# Patient Record
Sex: Female | Born: 1990 | Hispanic: No | Marital: Single | Smoking: Never smoker
Health system: Southern US, Community
[De-identification: ages and names within clinical notes are randomized; demographics above are authoritative.]

---

## 2015-02-06 ENCOUNTER — Emergency Department (HOSPITAL_COMMUNITY)
Admission: EM | Admit: 2015-02-06 | Discharge: 2015-02-06 | Disposition: A | Discharge status: Home / Self Care | Payer: BLUE CROSS/BLUE SHIELD | Attending: Emergency Medicine | Admitting: Emergency Medicine

## 2015-02-06 ENCOUNTER — Encounter (HOSPITAL_COMMUNITY): Payer: Self-pay | Admitting: Emergency Medicine

## 2015-02-06 DIAGNOSIS — J029 Acute pharyngitis, unspecified: Secondary | ICD-10-CM | POA: Insufficient documentation

## 2015-02-06 LAB — RAPID STREP SCREEN (MED CTR MEBANE ONLY): Streptococcus, Group A Screen (Direct): NEGATIVE

## 2015-02-06 NOTE — ED Notes (Signed)
Pt reports ongoing sore throat since Monday; denies pain at present time.

## 2015-02-06 NOTE — ED Provider Notes (Signed)
CSN: [id number]     Arrival date & time 02/06/15  1126 History  This chart was scribed for Eyvonne Mechanic, PA-C, working with Purvis Sheffield, MD by Octavia Heir, ED Scribe. This patient was seen in room WTR8/WTR8 and the patient's care was started at 12:10 PM.     Chief Complaint  Patient presents with  . Sore Throat     The history is provided by the patient. No language interpreter was used.  HPI Comments: Nancy Rangel is a 24 y.o. female who presents to the Emergency Department complaining of sore throat, x 6 days. Patient reports that pain is worse with swallowing, almost nonexistent at baseline. She notes yesterday her throat was more swollen than normal which caused concern. Pt reports feeling more tired than normal. Pt reports taking penicillin that her mother had to alleviate the pain with minimal relief. Pt denies difficulty swallowing, shortness of breath, rhinorrhea, cough, neck pain, watery eyes, rash, swelling of her legs. Pt further denies history of allergies. Patient has no other concerns at today's visit, reports that she was at work today was instructed to follow-up in the ED.   History reviewed. No pertinent past medical history. History reviewed. No pertinent past surgical history. No family history on file. History  Substance Use Topics  . Smoking status: Never Smoker   . Smokeless tobacco: Not on file  . Alcohol Use: No   OB History    No data available     Review of Systems  All other systems reviewed and are negative.   Allergies  Review of patient's allergies indicates no known allergies.  Home Medications   Prior to Admission medications   Not on File    Triage vitals: BP 127/85 mmHg  Pulse 94  Temp(Src) 98.6 F (37 C) (Oral)  Resp 16  SpO2 99%  LMP 02/02/2015 (Exact Date)  Physical Exam  Constitutional: She is oriented to person, place, and time. She appears well-developed and well-nourished. No distress.  HENT:  Head: Normocephalic and  atraumatic.  Mouth/Throat: Uvula is midline, oropharynx is clear and moist and mucous membranes are normal. No oropharyngeal exudate, posterior oropharyngeal edema, posterior oropharyngeal erythema or tonsillar abscesses.  Eyes: Conjunctivae and EOM are normal. Pupils are equal, round, and reactive to light. No scleral icterus.  Neck: Normal range of motion. Neck supple. No thyromegaly present.  Cardiovascular: Regular rhythm and normal heart sounds.  Exam reveals no gallop and no friction rub.   No murmur heard. Pulmonary/Chest: Effort normal and breath sounds normal. No respiratory distress.  Abdominal: She exhibits no distension. There is no tenderness. There is no rebound.  Musculoskeletal: Normal range of motion. She exhibits no edema.  Neurological: She is alert and oriented to person, place, and time. No sensory deficit.  Skin: Skin is warm and dry. No rash noted.  Psychiatric: She has a normal mood and affect. Her behavior is normal.  Nursing note and vitals reviewed.   ED Course  Procedures  DIAGNOSTIC STUDIES: Oxygen Saturation is 99% on RA, normal by my interpretation.  COORDINATION OF CARE:  12:33 PM   Labs Review Labs Reviewed  RAPID STREP SCREEN (NOT AT [facility])  CULTURE, GROUP A STREP    Imaging Review No results found.   EKG Interpretation None      MDM   Final diagnoses:  Sore throat    Labs:none   Imaging: none  Consults: none   Therapeutics: rapid strep negative   Assessment: pharyngitis   Plan: patient presents  with a sore throat for the last 6 days. She reports it's not significant enough to interfere with daily activities, she reports she's been working, has been able to eat and drink without difficulty, no fever, neck stiffness, tender cervical adenopathy, no rashes. Patient has no upper respiratory symptoms, allergic type symptoms, or any other concerning findings that would indicate an infectious source. Her exam was benign with no findings.  Uncertain as to the etiology of her sore throat but highly unlikely to be bacterial, unlikely be allergic or viral at this time. No need for further evaluation in the ED setting. Patient presents at the request of her employers to make sure she did not have strep throat. Patient strep negative, given instructions to use warm saltwater gargles, ibuprofen or Tylenol as needed for pain, strict return precautions the event new worsening signs or symptoms present. Patient was encouraged to follow up with her primary care provider if symptoms persist, or worsen. She verbalized her understanding and agreement for today's plan and had no further questions concerns at time of discharge.    I personally performed the services described in this documentation, which was scribed in my presence. The recorded information has been reviewed and is accurate.    Eyvonne Mechanic, PA-C 02/06/15 1234  Eyvonne Mechanic, PA-C 02/06/15 1235  Purvis Sheffield, MD 02/06/15 1622

## 2015-02-06 NOTE — Discharge Instructions (Signed)
Please read attention information, monitor for new or worsening signs or symptoms follow-up immediately if any present. Please follow-up with your primary care provider inform them of your visit today and all relevant data.

## 2015-02-09 LAB — CULTURE, GROUP A STREP: STREP A CULTURE: NEGATIVE

## 2020-10-28 ENCOUNTER — Ambulatory Visit
Admission: RE | Admit: 2020-10-28 | Discharge: 2020-10-28 | Disposition: A | Discharge status: Home / Self Care | Payer: No Typology Code available for payment source | Source: Ambulatory Visit | Attending: Family Medicine | Admitting: Family Medicine

## 2020-10-28 ENCOUNTER — Other Ambulatory Visit (HOSPITAL_COMMUNITY): Payer: Self-pay | Admitting: Family Medicine

## 2020-10-28 ENCOUNTER — Other Ambulatory Visit: Payer: Self-pay | Admitting: Family Medicine

## 2020-10-28 DIAGNOSIS — R0602 Shortness of breath: Secondary | ICD-10-CM

## 2020-10-28 MED FILL — ALBUTEROL SULFATE HFA 108 (: 108 (90 BAS | 34 days supply | Qty: 18 | Fill #0

## 2020-10-30 ENCOUNTER — Ambulatory Visit: Payer: No Typology Code available for payment source

## 2021-05-25 ENCOUNTER — Ambulatory Visit
Admission: EM | Admit: 2021-05-25 | Discharge: 2021-05-25 | Disposition: A | Discharge status: Home / Self Care | Payer: No Typology Code available for payment source | Attending: Emergency Medicine | Admitting: Emergency Medicine

## 2021-05-25 ENCOUNTER — Other Ambulatory Visit: Payer: Self-pay

## 2021-05-25 ENCOUNTER — Other Ambulatory Visit (HOSPITAL_BASED_OUTPATIENT_CLINIC_OR_DEPARTMENT_OTHER): Payer: Self-pay

## 2021-05-25 DIAGNOSIS — M546 Pain in thoracic spine: Secondary | ICD-10-CM | POA: Diagnosis not present

## 2021-05-25 LAB — POCT URINALYSIS DIP (MANUAL ENTRY)
Bilirubin, UA: NEGATIVE
Glucose, UA: NEGATIVE mg/dL
Ketones, POC UA: NEGATIVE mg/dL
Leukocytes, UA: NEGATIVE
Nitrite, UA: NEGATIVE
Protein Ur, POC: NEGATIVE mg/dL
Spec Grav, UA: 1.02 (ref 1.010–1.025)
Urobilinogen, UA: 0.2 E.U./dL
pH, UA: 6 (ref 5.0–8.0)

## 2021-05-25 MED ORDER — TIZANIDINE HCL 2 MG PO TABS
2.0000 mg | ORAL_TABLET | Freq: Four times a day (QID) | ORAL | 0 refills | Status: AC | PRN
  Filled 2021-05-25: qty 30, 4d supply, fill #0

## 2021-05-25 MED ORDER — NAPROXEN 500 MG PO TABS
500.0000 mg | ORAL_TABLET | Freq: Two times a day (BID) | ORAL | 0 refills | Status: AC
  Filled 2021-05-25: qty 30, 15d supply, fill #0

## 2021-05-25 NOTE — Discharge Instructions (Addendum)
Urine does not show any signs of infection Please use Naprosyn twice daily with food Tizanidine to supplement at home/bedtime-this is a muscle relaxer, may cause drowsiness, do not drive or work after taking Avoid bedrest, avoid heavy lifting Alternate ice and heat Gentle stretching and movement Please follow-up if not improving or worsening

## 2021-05-25 NOTE — ED Provider Notes (Signed)
[facility]    CSN: [id number] Arrival date & time: 05/25/21  0957      History   Chief Complaint Chief Complaint  Patient presents with   Back Pain    HPI Nancy Rangel is a 30 y.o. female presenting today for evaluation of back pain.  Reports left-sided back pain beginning yesterday.  Located on lower thoracic/upper lumbar area, worse with movement and bending.  Describes a dull aching sensation.  Denies any radiation into abdomen, groin or leg.  Denies any urinary symptoms, but does express concern for possible UTI/kidney infection.  Denies fevers nausea or vomiting.  Denies history of stones.  Currently on menstrual cycle.  Feels different than typical back pain patient is experienced in the past.  HPI  History reviewed. No pertinent past medical history.  There are no problems to display for this patient.   History reviewed. No pertinent surgical history.  OB History   No obstetric history on file.      Home Medications    Prior to Admission medications   Medication Sig Start Date End Date Taking? Authorizing Provider  naproxen (NAPROSYN) 500 MG tablet Take 1 tablet (500 mg total) by mouth 2 (two) times daily. 05/25/21  Yes Nikolos Billig C, PA-C  tiZANidine (ZANAFLEX) 2 MG tablet Take 1-2 tablets (2-4 mg total) by mouth every 6 (six) hours as needed for muscle spasms. 05/25/21  Yes Ashlee Bewley C, PA-C  albuterol (VENTOLIN HFA) 108 (90 Base) MCG/ACT inhaler INHALE 1 PUFFS BY MOUTH INTO THE LUNGS EVERY 4 HOURS AS NEEDED 10/28/20 10/28/21  Shon Hale, MD    Family History No family history on file.  Social History Social History   Tobacco Use   Smoking status: Never   Smokeless tobacco: Never  Vaping Use   Vaping Use: Never used  Substance Use Topics   Alcohol use: Yes   Drug use: No     Allergies   Patient has no known allergies.   Review of Systems Review of Systems  Constitutional:  Negative for fatigue and fever.  HENT:   Negative for mouth sores.   Eyes:  Negative for visual disturbance.  Respiratory:  Negative for shortness of breath.   Cardiovascular:  Negative for chest pain.  Gastrointestinal:  Negative for abdominal pain, nausea and vomiting.  Musculoskeletal:  Positive for back pain and myalgias. Negative for arthralgias and joint swelling.  Skin:  Negative for color change, rash and wound.  Neurological:  Negative for dizziness, weakness, light-headedness and headaches.    Physical Exam Triage Vital Signs ED Triage Vitals  Enc Vitals Group     BP      Pulse      Resp      Temp      Temp src      SpO2      Weight      Height      Head Circumference      Peak Flow      Pain Score      Pain Loc      Pain Edu?      Excl. in GC?    No data found.  Updated Vital Signs BP 113/74 (BP Location: Right Arm)   Pulse 73   Temp 97.7 F (36.5 C) (Oral)   Resp 18   LMP 05/23/2021   SpO2 97%   Visual Acuity Right Eye Distance:   Left Eye Distance:   Bilateral Distance:    Right Eye  Near:   Left Eye Near:    Bilateral Near:     Physical Exam Vitals and nursing note reviewed.  Constitutional:      Appearance: She is well-developed.     Comments: No acute distress  HENT:     Head: Normocephalic and atraumatic.     Nose: Nose normal.  Eyes:     Conjunctiva/sclera: Conjunctivae normal.  Cardiovascular:     Rate and Rhythm: Normal rate.  Pulmonary:     Effort: Pulmonary effort is normal. No respiratory distress.     Comments: Breathing comfortably at rest, CTABL, no wheezing, rales or other adventitious sounds auscultated  Abdominal:     General: There is no distension.  Musculoskeletal:        General: Normal range of motion.     Cervical back: Neck supple.     Comments: Nontender to palpation of thoracic and lumbar spine midline, no palpable deformity or step-off, tenderness to palpation to lower thoracic and upper lumbar area on left side to far lateral aspect, no associated  rash or discoloration in area  Skin:    General: Skin is warm and dry.  Neurological:     Mental Status: She is alert and oriented to person, place, and time.     UC Treatments / Results  Labs (all labs ordered are listed, but only abnormal results are displayed) Labs Reviewed  POCT URINALYSIS DIP (MANUAL ENTRY) - Abnormal; Notable for the following components:      Result Value   Color, UA other (*)    Clarity, UA cloudy (*)    Blood, UA large (*)    All other components within normal limits    EKG   Radiology No results found.  Procedures Procedures (including critical care time)  Medications Ordered in UC Medications - No data to display  Initial Impression / Assessment and Plan / UC Course  I have reviewed the triage vital signs and the nursing notes.  Pertinent labs & imaging results that were available during my care of the patient were reviewed by me and considered in my medical decision making (see chart for details).     UA with negative leuks and nitrites, moderate hemoglobin, but said suspect likely from menstrual cycle and less likely underlying stone.  Suspect likely muscular cause of pain and recommending anti-inflammatories muscle relaxers, activity modification and monitoring for gradual resolution.  Discussed strict return precautions. Patient verbalized understanding and is agreeable with plan.  Final Clinical Impressions(s) / UC Diagnoses   Final diagnoses:  Acute left-sided thoracic back pain     Discharge Instructions      Urine does not show any signs of infection Please use Naprosyn twice daily with food Tizanidine to supplement at home/bedtime-this is a muscle relaxer, may cause drowsiness, do not drive or work after taking Avoid bedrest, avoid heavy lifting Alternate ice and heat Gentle stretching and movement Please follow-up if not improving or worsening     ED Prescriptions     Medication Sig Dispense Auth. Provider    naproxen (NAPROSYN) 500 MG tablet Take 1 tablet (500 mg total) by mouth 2 (two) times daily. 30 tablet Kyren Knick C, PA-C   tiZANidine (ZANAFLEX) 2 MG tablet Take 1-2 tablets (2-4 mg total) by mouth every 6 (six) hours as needed for muscle spasms. 30 tablet Vonte Rossin, [place] C, PA-C      PDMP not reviewed this encounter.   Lew Dawes, PA-C 05/25/21 1121

## 2021-05-25 NOTE — ED Triage Notes (Addendum)
Pt reports having some lower back pain to the left that started yesterday. Pt denies having any lower abd pain, or vaginal issues.

## 2021-06-17 ENCOUNTER — Other Ambulatory Visit (HOSPITAL_BASED_OUTPATIENT_CLINIC_OR_DEPARTMENT_OTHER): Payer: Self-pay

## 2021-06-17 MED ORDER — ALBUTEROL SULFATE HFA 108 (90 BASE) MCG/ACT IN AERS
INHALATION_SPRAY | RESPIRATORY_TRACT | 1 refills | Status: AC
  Filled 2021-06-17: qty 36, 66d supply, fill #0
  Filled 2022-03-29: qty 13.4, 56d supply, fill #0

## 2021-06-28 ENCOUNTER — Other Ambulatory Visit (HOSPITAL_BASED_OUTPATIENT_CLINIC_OR_DEPARTMENT_OTHER): Payer: Self-pay

## 2021-12-13 ENCOUNTER — Other Ambulatory Visit: Payer: Self-pay | Admitting: Family Medicine

## 2021-12-13 ENCOUNTER — Other Ambulatory Visit (HOSPITAL_COMMUNITY)
Admission: RE | Admit: 2021-12-13 | Discharge: 2021-12-13 | Disposition: A | Discharge status: Home / Self Care | Payer: No Typology Code available for payment source | Source: Ambulatory Visit | Attending: Family Medicine | Admitting: Family Medicine

## 2021-12-13 DIAGNOSIS — Z01411 Encounter for gynecological examination (general) (routine) with abnormal findings: Secondary | ICD-10-CM | POA: Insufficient documentation

## 2021-12-15 LAB — CYTOLOGY - PAP: Diagnosis: NEGATIVE

## 2022-01-10 ENCOUNTER — Other Ambulatory Visit (HOSPITAL_BASED_OUTPATIENT_CLINIC_OR_DEPARTMENT_OTHER): Payer: Self-pay

## 2022-01-10 ENCOUNTER — Telehealth: Payer: No Typology Code available for payment source | Admitting: Physician Assistant

## 2022-01-10 DIAGNOSIS — H1033 Unspecified acute conjunctivitis, bilateral: Secondary | ICD-10-CM | POA: Diagnosis not present

## 2022-01-10 MED ORDER — POLYMYXIN B-TRIMETHOPRIM 10000-0.1 UNIT/ML-% OP SOLN
OPHTHALMIC | 0 refills | Status: AC
  Filled 2022-01-10: qty 10, 25d supply, fill #0

## 2022-01-10 NOTE — Progress Notes (Signed)

## 2022-01-10 NOTE — Progress Notes (Signed)
I have spent 5 minutes in review of e-visit questionnaire, review and updating patient chart, medical decision making and response to patient.   Aidian Salomon Cody Quanta Roher, PA-C    

## 2022-02-27 IMAGING — DX DG CHEST 2V
2 series · 2 of 2 positions shown · non-contrast
Comparison: None.

CLINICAL DATA: 29-year-old female with history of shortness of
breath and dry cough following COVID infection in July 2020.

EXAM:
CHEST - 2 VIEW

[dg chest 2 view (1 of 2)]
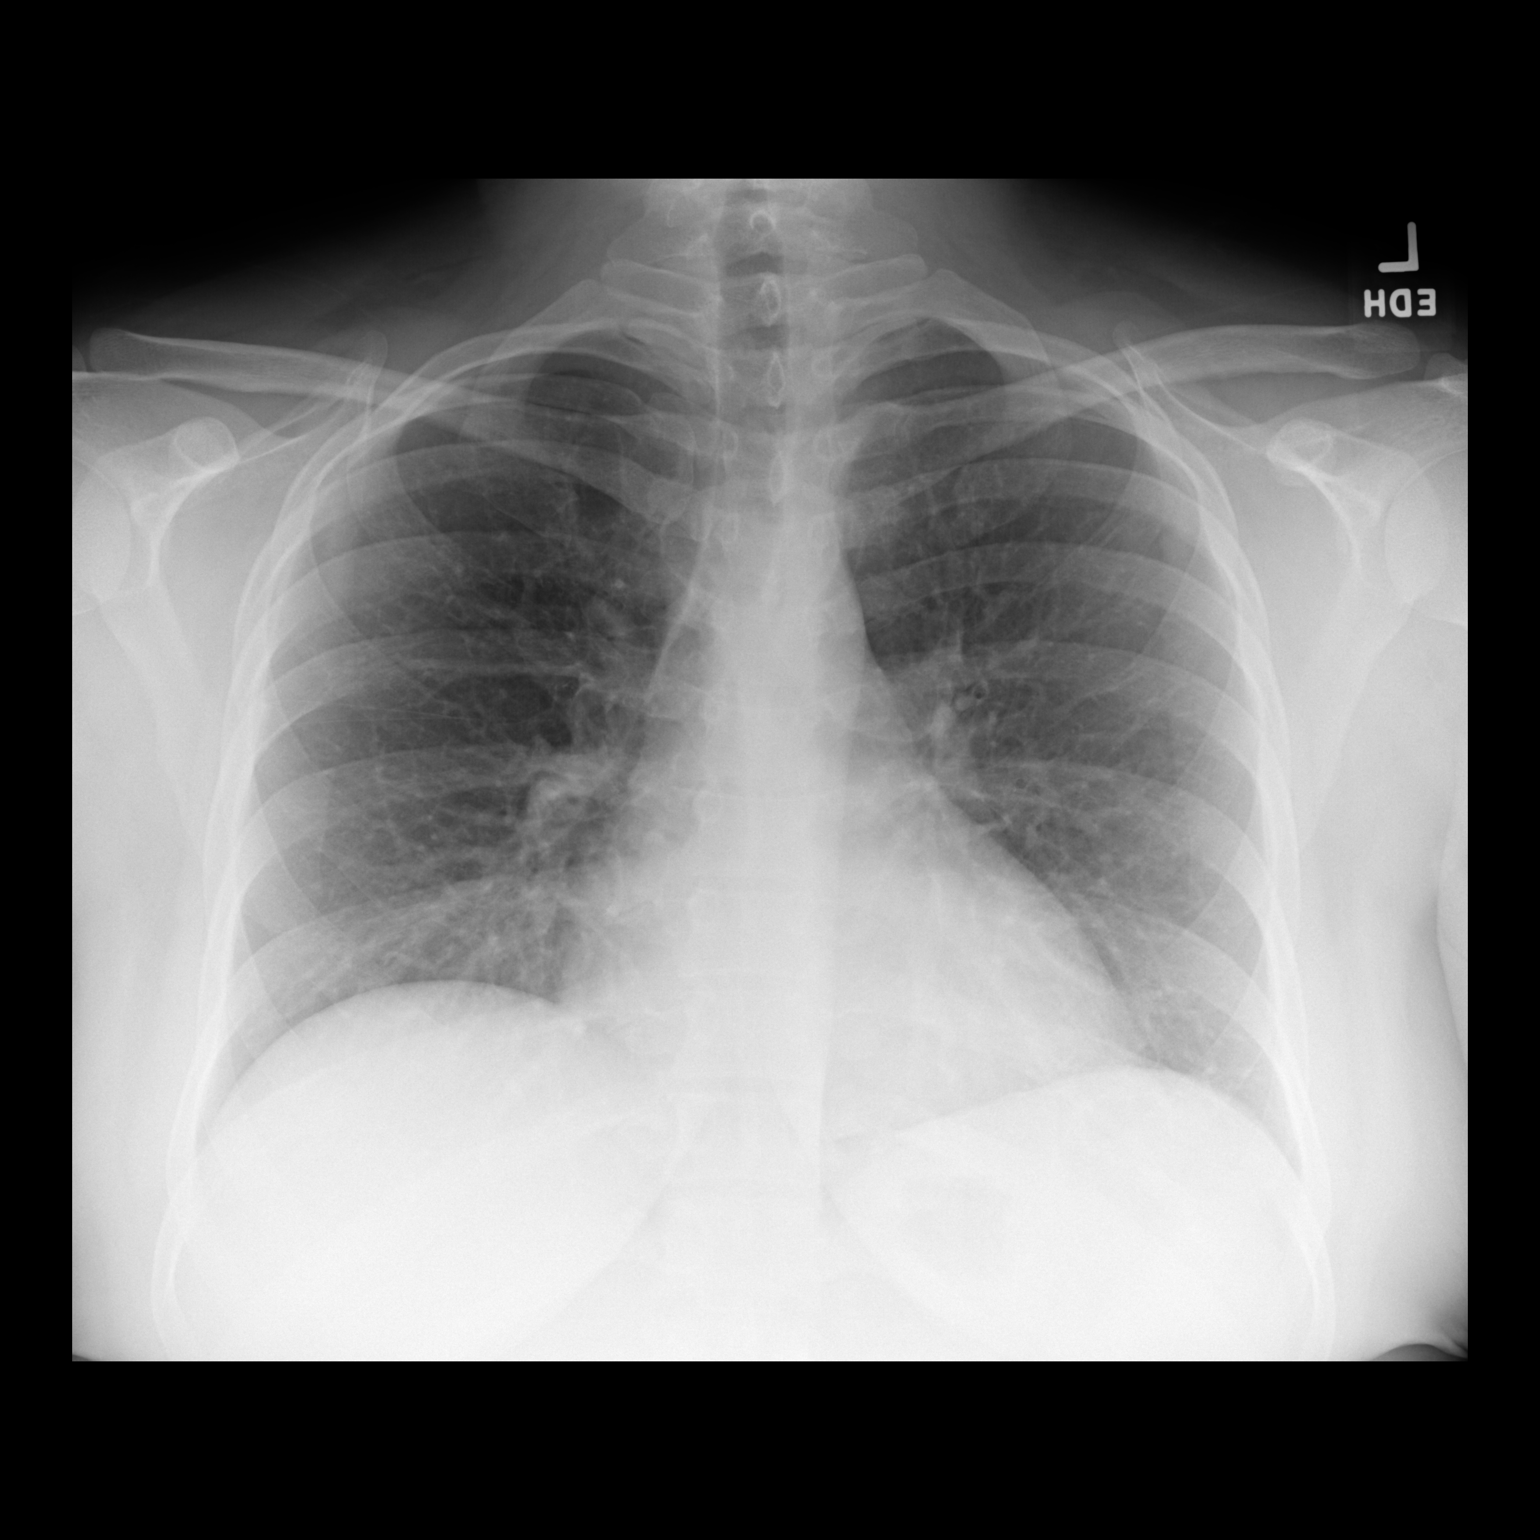

[dg chest 2 view (2 of 2)]
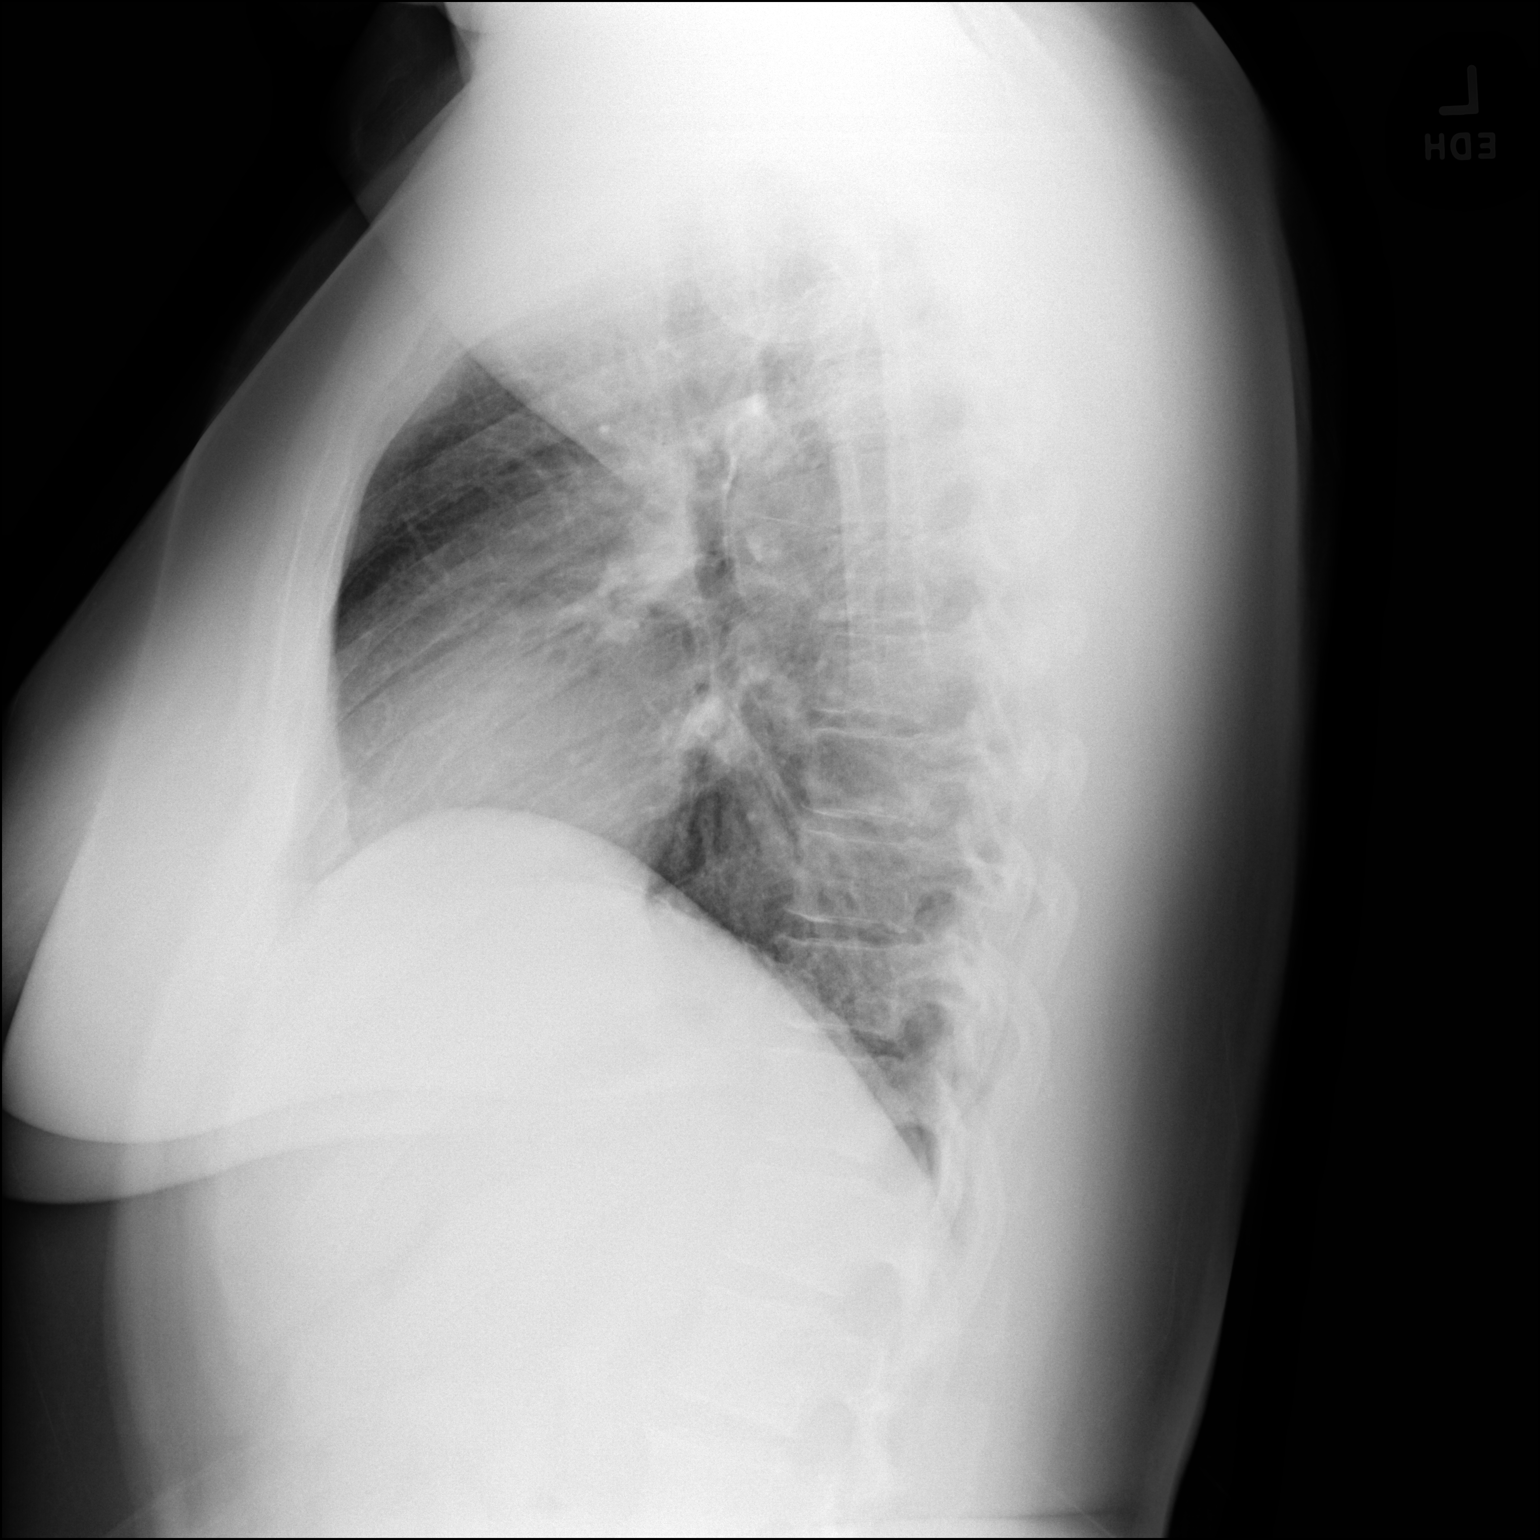

[2 of 2 positions shown; findings below may reference images not displayed]

FINDINGS: Lung volumes are normal. No consolidative airspace disease. No
pleural effusions. No pneumothorax. No pulmonary nodule or mass
noted. Pulmonary vasculature and the cardiomediastinal silhouette
are within normal limits.
IMPRESSION: No radiographic evidence of acute cardiopulmonary disease.

## 2022-03-11 ENCOUNTER — Ambulatory Visit (INDEPENDENT_AMBULATORY_CARE_PROVIDER_SITE_OTHER): Payer: No Typology Code available for payment source | Admitting: Podiatry

## 2022-03-11 ENCOUNTER — Other Ambulatory Visit (HOSPITAL_BASED_OUTPATIENT_CLINIC_OR_DEPARTMENT_OTHER): Payer: Self-pay

## 2022-03-11 ENCOUNTER — Encounter: Payer: Self-pay | Admitting: Podiatry

## 2022-03-11 ENCOUNTER — Ambulatory Visit (INDEPENDENT_AMBULATORY_CARE_PROVIDER_SITE_OTHER): Payer: No Typology Code available for payment source

## 2022-03-11 DIAGNOSIS — M722 Plantar fascial fibromatosis: Secondary | ICD-10-CM

## 2022-03-11 DIAGNOSIS — M79671 Pain in right foot: Secondary | ICD-10-CM

## 2022-03-11 DIAGNOSIS — M79672 Pain in left foot: Secondary | ICD-10-CM | POA: Diagnosis not present

## 2022-03-11 MED ORDER — MELOXICAM 15 MG PO TABS
15.0000 mg | ORAL_TABLET | Freq: Every day | ORAL | 0 refills | Status: AC
  Filled 2022-03-11 – 2022-03-29 (×2): qty 30, 30d supply, fill #0

## 2022-03-11 MED ORDER — DEXAMETHASONE SODIUM PHOSPHATE 120 MG/30ML IJ SOLN: 4.0000 mg | Freq: Once | INTRAMUSCULAR | Status: AC

## 2022-03-11 NOTE — Progress Notes (Signed)
  Subjective:  Patient ID: Nancy Rangel, female    DOB: 10-May-1991,   MRN: [id number]  Chief Complaint  Patient presents with   Foot Pain    Bilateral  heel pain    31 y.o. female presents for concern of bilateral heel pain. States right is worse than left. Has been dealing with it for several months. She fractured her right ankle last year. Relates she gets pain when standing for long periods and when walking. She has tried some OTC medications with some help. She is pre-diabetic. Denies any other pedal complaints. Denies n/v/f/c.   History reviewed. No pertinent past medical history.  Objective:  Physical Exam: Vascular: DP/PT pulses 2/4 bilateral. CFT <3 seconds. Normal hair growth on digits. No edema.  Skin. No lacerations or abrasions bilateral feet.  Musculoskeletal: MMT 5/5 bilateral lower extremities in DF, PF, Inversion and Eversion. Deceased ROM in DF of ankle joint. Tender to medial calcaneal tubercle on the right more so than the left.  Neurological: Sensation intact to light touch.   Assessment:   1. Plantar fasciitis, bilateral      Plan:  Patient was evaluated and treated and all questions answered. Discussed plantar fasciitis with patient.  X-rays reviewed and discussed with patient. No acute fractures or dislocations noted. Mild spurring noted at inferior calcaneus.  Discussed treatment options including, ice, NSAIDS, supportive shoes, bracing, and stretching. Stretching exercises provided to be done on a daily basis.   Prescription for meloxicam provided and sent to pharmacy. Patient requesting injection today. Procedure note below.   Pf brace dispensed.  Would like to try a night splint as well.  Follow-up 6 weeks or sooner if any problems arise. In the meantime, encouraged to call the office with any questions, concerns, change in symptoms.   Procedure:  Discussed etiology, pathology, conservative vs. surgical therapies. At this time a plantar fascial  injection was recommended.  The patient agreed and a sterile skin prep was applied.  An injection consisting of  dexamethasone and marcaine mixture was infiltrated at the point of maximal tenderness on the right Heel.  Bandaid applied. The patient tolerated this well and was given instructions for aftercare.     Louann Sjogren, DPM

## 2022-03-11 NOTE — Patient Instructions (Signed)

## 2022-03-21 ENCOUNTER — Other Ambulatory Visit (HOSPITAL_BASED_OUTPATIENT_CLINIC_OR_DEPARTMENT_OTHER): Payer: Self-pay

## 2022-03-29 ENCOUNTER — Other Ambulatory Visit (HOSPITAL_BASED_OUTPATIENT_CLINIC_OR_DEPARTMENT_OTHER): Payer: Self-pay

## 2022-04-22 ENCOUNTER — Ambulatory Visit: Payer: No Typology Code available for payment source | Admitting: Podiatry

## 2022-04-29 ENCOUNTER — Encounter: Payer: Self-pay | Admitting: Podiatry

## 2022-05-12 ENCOUNTER — Ambulatory Visit: Payer: No Typology Code available for payment source | Admitting: Podiatry

## 2022-05-19 ENCOUNTER — Ambulatory Visit: Payer: No Typology Code available for payment source | Admitting: Podiatry

## 2022-07-12 ENCOUNTER — Other Ambulatory Visit: Payer: Self-pay

## 2022-07-12 ENCOUNTER — Emergency Department (HOSPITAL_BASED_OUTPATIENT_CLINIC_OR_DEPARTMENT_OTHER)
Admission: EM | Admit: 2022-07-12 | Discharge: 2022-07-12 | Disposition: A | Discharge status: Home / Self Care | Payer: No Typology Code available for payment source | Attending: Emergency Medicine | Admitting: Emergency Medicine

## 2022-07-12 ENCOUNTER — Emergency Department (HOSPITAL_BASED_OUTPATIENT_CLINIC_OR_DEPARTMENT_OTHER): Payer: No Typology Code available for payment source

## 2022-07-12 ENCOUNTER — Encounter (HOSPITAL_BASED_OUTPATIENT_CLINIC_OR_DEPARTMENT_OTHER): Payer: Self-pay | Admitting: Emergency Medicine

## 2022-07-12 DIAGNOSIS — R1084 Generalized abdominal pain: Secondary | ICD-10-CM | POA: Diagnosis present

## 2022-07-12 DIAGNOSIS — K8020 Calculus of gallbladder without cholecystitis without obstruction: Secondary | ICD-10-CM

## 2022-07-12 LAB — URINALYSIS, ROUTINE W REFLEX MICROSCOPIC
Bilirubin Urine: NEGATIVE
Glucose, UA: NEGATIVE mg/dL
Hgb urine dipstick: NEGATIVE
Ketones, ur: NEGATIVE mg/dL
Leukocytes,Ua: NEGATIVE
Nitrite: NEGATIVE
Protein, ur: NEGATIVE mg/dL
Specific Gravity, Urine: 1.02 (ref 1.005–1.030)
pH: 8.5 — ABNORMAL HIGH (ref 5.0–8.0)

## 2022-07-12 LAB — COMPREHENSIVE METABOLIC PANEL
ALT: 18 U/L (ref 0–44)
AST: 17 U/L (ref 15–41)
Albumin: 3.7 g/dL (ref 3.5–5.0)
Alkaline Phosphatase: 79 U/L (ref 38–126)
Anion gap: 6 (ref 5–15)
BUN: 11 mg/dL (ref 6–20)
CO2: 23 mmol/L (ref 22–32)
Calcium: 8.6 mg/dL — ABNORMAL LOW (ref 8.9–10.3)
Chloride: 106 mmol/L (ref 98–111)
Creatinine, Ser: 0.57 mg/dL (ref 0.44–1.00)
GFR, Estimated: >60 mL/min (ref >60)
Glucose, Bld: 131 mg/dL — ABNORMAL HIGH (ref 70–99)
Potassium: 3.9 mmol/L (ref 3.5–5.1)
Sodium: 135 mmol/L (ref 135–145)
Total Bilirubin: 0.5 mg/dL (ref 0.3–1.2)
Total Protein: 8.1 g/dL (ref 6.5–8.1)

## 2022-07-12 LAB — CBC
HCT: 41.2 % (ref 36.0–46.0)
Hemoglobin: 13.1 g/dL (ref 12.0–15.0)
MCH: 25 pg — ABNORMAL LOW (ref 26.0–34.0)
MCHC: 31.8 g/dL (ref 30.0–36.0)
MCV: 78.5 fL — ABNORMAL LOW (ref 80.0–100.0)
Platelets: 295 10*3/uL (ref 150–400)
RBC: 5.25 MIL/uL — ABNORMAL HIGH (ref 3.87–5.11)
RDW: 14.6 % (ref 11.5–15.5)
WBC: 8.9 10*3/uL (ref 4.0–10.5)
nRBC: 0 % (ref 0.0–0.2)

## 2022-07-12 LAB — PREGNANCY, URINE: Preg Test, Ur: NEGATIVE

## 2022-07-12 LAB — LIPASE, BLOOD: Lipase: 35 U/L (ref 11–51)

## 2022-07-12 MED ORDER — ONDANSETRON HCL 4 MG/2ML IJ SOLN
4.0000 mg | Freq: Once | INTRAMUSCULAR | Status: AC | PRN
  Administered 2022-07-12: 4 mg via INTRAVENOUS
  Filled 2022-07-12: qty 2

## 2022-07-12 MED ORDER — FAMOTIDINE 20 MG PO TABS
20.0000 mg | ORAL_TABLET | Freq: Once | ORAL | Status: AC
  Administered 2022-07-12: 20 mg via ORAL
  Filled 2022-07-12: qty 1

## 2022-07-12 MED ORDER — LACTATED RINGERS IV BOLUS
1000.0000 mL | Freq: Once | INTRAVENOUS | Status: AC
  Administered 2022-07-12: 1000 mL via INTRAVENOUS

## 2022-07-12 MED ORDER — LIDOCAINE VISCOUS HCL 2 % MT SOLN
15.0000 mL | Freq: Once | OROMUCOSAL | Status: AC
  Administered 2022-07-12: 15 mL via ORAL
  Filled 2022-07-12: qty 15

## 2022-07-12 MED ORDER — MORPHINE SULFATE (PF) 4 MG/ML IV SOLN
4.0000 mg | Freq: Once | INTRAVENOUS | Status: DC
  Filled 2022-07-12: qty 1

## 2022-07-12 MED ORDER — KETOROLAC TROMETHAMINE 15 MG/ML IJ SOLN
15.0000 mg | Freq: Once | INTRAMUSCULAR | Status: AC
Start: 2022-07-12 — End: 2022-07-12
  Administered 2022-07-12: 15 mg via INTRAVENOUS
  Filled 2022-07-12: qty 1

## 2022-07-12 MED ORDER — ALUM & MAG HYDROXIDE-SIMETH 200-200-20 MG/5ML PO SUSP
30.0000 mL | Freq: Once | ORAL | Status: AC
  Administered 2022-07-12: 30 mL via ORAL
  Filled 2022-07-12: qty 30

## 2022-07-12 NOTE — ED Notes (Signed)
Pt requesting pain medication. Endorses 5/10. EDP [place] notified

## 2022-07-12 NOTE — ED Notes (Signed)
US at bedside

## 2022-07-12 NOTE — ED Provider Notes (Signed)
[facility] Provider Note   CSN: [id number] Arrival date & time: 07/12/22  [id number]     History Chief Complaint  Patient presents with   Abdominal Pain    HPI Nancy Rangel is a 31 y.o. female presenting for diffuse abdominal pain.  She states that it started yesterday after eating at a fast food restaurant.  She states that she frequently gets episodes like this if she eats greasy foods.  Pain is epigastric radiating to her left posterior back.  She denies fevers or chills, nausea vomiting, syncope shortness of breath.  She unfortunately had paroxysms of pain all throughout the evening last evening.  Tried to tolerate her symptoms but unfortunately they became progressive.  Patient's recorded medical, surgical, social, medication list and allergies were reviewed in the Snapshot window as part of the initial history.   Review of Systems   Review of Systems  Constitutional:  Negative for chills and fever.  HENT:  Negative for ear pain and sore throat.   Eyes:  Negative for pain and visual disturbance.  Respiratory:  Negative for cough and shortness of breath.   Cardiovascular:  Negative for chest pain and palpitations.  Gastrointestinal:  Positive for abdominal pain, nausea and vomiting.  Genitourinary:  Negative for dysuria and hematuria.  Musculoskeletal:  Negative for arthralgias and back pain.  Skin:  Negative for color change and rash.  Neurological:  Negative for seizures and syncope.  All other systems reviewed and are negative.   Physical Exam Updated Vital Signs BP 125/83   Pulse 88   Temp 98.5 F (36.9 C) (Oral)   Resp 17   LMP 06/27/2022   SpO2 100%  Physical Exam Vitals and nursing note reviewed.  Constitutional:      General: She is not in acute distress.    Appearance: She is well-developed.  HENT:     Head: Normocephalic and atraumatic.  Eyes:     Conjunctiva/sclera: Conjunctivae normal.  Cardiovascular:     Rate and  Rhythm: Normal rate and regular rhythm.     Heart sounds: No murmur heard. Pulmonary:     Effort: Pulmonary effort is normal. No respiratory distress.     Breath sounds: Normal breath sounds.  Abdominal:     Palpations: Abdomen is soft.     Tenderness: There is abdominal tenderness in the right upper quadrant, epigastric area and left upper quadrant. There is no right CVA tenderness or left CVA tenderness. Positive signs include Murphy's sign.  Musculoskeletal:        General: No swelling.     Cervical back: Neck supple.  Skin:    General: Skin is warm and dry.     Capillary Refill: Capillary refill takes less than 2 seconds.  Neurological:     Mental Status: She is alert.  Psychiatric:        Mood and Affect: Mood normal.      ED Course/ Medical Decision Making/ A&P    Procedures Procedures   Medications Ordered in ED Medications  morphine (PF) 4 MG/ML injection 4 mg (4 mg Intravenous Not Given 07/12/22 0830)  ondansetron (ZOFRAN) injection 4 mg (4 mg Intravenous Given 07/12/22 0829)  famotidine (PEPCID) tablet 20 mg (20 mg Oral Given 07/12/22 0829)  alum & mag hydroxide-simeth (MAALOX/MYLANTA) 200-200-20 MG/5ML suspension 30 mL (30 mLs Oral Given 07/12/22 0829)    And  lidocaine (XYLOCAINE) 2 % viscous mouth solution 15 mL (15 mLs Oral Given 07/12/22 0829)  lactated ringers bolus  1,000 mL ( Intravenous Stopped 07/12/22 0956)   Medical Decision Making:   Nancy Rangel is a 31 y.o. female who presented to the ED today with abdominal pain, detailed above.    Additional history discussed with patient's family/caregivers.  Patient's presentation is complicated by their history of elevated BMI.  Patient placed on continuous vitals and telemetry monitoring while in ED which was reviewed periodically.  Complete initial physical exam performed, notably the patient  was HDS in NAD.  Diffuse upper abdominal tenderness on exam.     Reviewed and confirmed nursing documentation for  past medical history, family history, social history.    Initial Assessment:   With the patient's presentation of abdominal pain, most likely diagnosis is nonspecific etiology. Other diagnoses were considered including (but not limited to) gastroenteritis, colitis, small bowel obstruction, appendicitis, cholecystitis, pancreatitis, nephrolithiasis, UTI, pyleonephritis, ruptured ectopic pregnancy, PID, ovarian torsion. These are considered less likely due to history of present illness and physical exam findings.   This is most consistent with an acute life/limb threatening illness complicated by underlying chronic conditions.   Initial Plan:  CBC/CMP to evaluate for underlying infectious/metabolic etiology for patient's abdominal pain  Lipase to evaluate for pancreatitis  RUQ [place] evaluate for structural/surgical etiology of patients' severe abdominal pain.  Urinalysis and repeat physical assessment to evaluate for UTI/Pyelonpehritis  Empiric management of symptoms with escalating pain control and antiemetics as needed.   Initial Study Results:   Laboratory  All laboratory results reviewed without evidence of clinically relevant pathology.     Radiology All images reviewed independently. Agree with radiology report at this time.   US Abdomen Limited RUQ (LIVER/GB)  Result Date: 07/12/2022 CLINICAL DATA:  Right upper quadrant abdominal pain. Nausea and vomiting. EXAM: ULTRASOUND ABDOMEN LIMITED RIGHT UPPER QUADRANT COMPARISON:  None Available. FINDINGS: Gallbladder: Cholelithiasis with the largest stone measuring 2.3 cm. No Murphy sign. No wall thickening or adjacent fluid. Common bile duct: Diameter: 3.5 mm, normal. Liver: No focal lesion identified. Within normal limits in parenchymal echogenicity. Portal vein is patent on color Doppler imaging with normal direction of blood flow towards the liver. Other: None. IMPRESSION: Cholelithiasis without evidence of cholecystitis or obstruction by  sonography. The largest stone measures 2.3 cm. Electronically Signed   By: Paulina Fusi M.D.   On: 07/12/2022 09:15    Final Reassessment and Plan:   Fortunately patient is overall well-appearing.  She is ambulatory and tolerating p.o. intake.  Symptoms have grossly resolved on reassessment. Likely symptomatic cholelithiasis with intermittent biliary spasm and biliary colic.  Given her well appearance at this time, no acute indication for further intervention in the emergency department.  We will have patient follow-up in the outpatient setting with general surgery and have recommended supportive care.  Strict return precautions regarding development of cholecystitis reinforced and patient expressed understanding.  Patient discharged with no further acute events. Disposition:  I have considered need for hospitalization, however, considering all of the above, I believe this patient is stable for discharge at this time.  Patient/family educated about specific return precautions for given chief complaint and symptoms.  Patient/family educated about follow-up with PCP.     Patient/family expressed understanding of return precautions and need for follow-up. Patient spoken to regarding all imaging and laboratory results and appropriate follow up for these results. All education provided in verbal form with additional information in written form. Time was allowed for answering of patient questions. Patient discharged.    Emergency Department Medication Summary:   Medications  morphine (PF) 4 MG/ML injection 4 mg (4 mg Intravenous Not Given 07/12/22 0830)  ondansetron (ZOFRAN) injection 4 mg (4 mg Intravenous Given 07/12/22 0829)  famotidine (PEPCID) tablet 20 mg (20 mg Oral Given 07/12/22 0829)  alum & mag hydroxide-simeth (MAALOX/MYLANTA) 200-200-20 MG/5ML suspension 30 mL (30 mLs Oral Given 07/12/22 0829)    And  lidocaine (XYLOCAINE) 2 % viscous mouth solution 15 mL (15 mLs Oral Given 07/12/22 0829)   lactated ringers bolus 1,000 mL ( Intravenous Stopped 07/12/22 0956)            Clinical Impression:  1. Symptomatic cholelithiasis   2. Generalized abdominal pain      Discharge   Final Clinical Impression(s) / ED Diagnoses Final diagnoses:  Generalized abdominal pain  Symptomatic cholelithiasis    Rx / DC Orders ED Discharge Orders     None         Glyn Ade, MD 07/12/22 1046

## 2022-07-12 NOTE — ED Notes (Signed)
Pt discharged to home. Discharge instructions have been discussed with patient and/or family members. Pt verbally acknowledges understanding d/c instructions, and endorses comprehension to checkout at registration before leaving.  °

## 2022-07-12 NOTE — ED Notes (Signed)
ED Provider at bedside. 

## 2022-07-12 NOTE — ED Triage Notes (Addendum)
Pt having abdominal pain radiating through her back since last night after eating [employer].  Some nausea, vomiting x 1, no diarrhea, no fever.  Took OTC with no relief.

## 2022-07-12 NOTE — Discharge Instructions (Addendum)
You were seen today for abdominal pain. We did not identify any emergent cause for your symptoms. Your evaluation is most consistent with gallstones.   Plan and next steps:   The following may be helpful in managing your symptoms:   Pain- Lidocaine Patches  Apply to affected area for up to 12 hours at a time.   Pain/Fever- Adult Tylenol dosing:  650 mg orally every 4 to 6 hours as needed, MAX: 3250 mg/24 hours   (Extra-strength) 1000 mg orally every 6 hours as needed; MAX: 3000 mg/24 hours   Do not use if you have liver disease. Read the label on the bottle.   Pain/Fever- Adult Ibuprofen Dosing  200 to 400 mg orally every 4 to 6 hours as needed; MAX 1200 mg/day; do not take longer than 10 days   Do not use if you have kidney disease. Read the label on the bottle   Gastritis/Heartburn- Omeprazole Take 20mg  Daily for 14 days Gastritis/Heartburn- Pepcid  Take 20mg  as needed for 14 days Gastritis/Heartburn- Maalox  Take as needed for 14 days  Findings:  You may see all of your lab and imaging results utilizing our online portal! Look in this document or ask a team member for your mychart* access information. The most notable results have additionally been verbally communicated with you and your bedside family.    Follow-up Plan:   Follow up with the patient's normal primary care provider for monitoring of this condition within 48 hours.   Signs/Symptoms that would warrant return to the ED:  Please return to the ED if you experience worsening of symptoms or any abrupt changes in your health. Standard of care precautions for your chief complaint have already been verbally communicated with you. Always be on alert for fevers, chills, shortness of breath, chest pains, or sudden changes that warrant immediate evaluation.    Thank you for allowing to be a part of you and your families' care.   MD

## 2022-07-30 ENCOUNTER — Telehealth: Payer: No Typology Code available for payment source | Admitting: Family

## 2022-07-30 DIAGNOSIS — J019 Acute sinusitis, unspecified: Secondary | ICD-10-CM

## 2022-07-31 MED ORDER — AMOXICILLIN-POT CLAVULANATE 875-125 MG PO TABS
1.0000 | ORAL_TABLET | Freq: Two times a day (BID) | ORAL | 0 refills | Status: DC
  Filled 2022-07-31: qty 14, 7d supply, fill #0

## 2022-07-31 NOTE — Addendum Note (Signed)
Addended by: Jannifer Rodney A on: 07/31/2022 08:44 AM   Modules accepted: Orders

## 2022-07-31 NOTE — Progress Notes (Signed)

## 2022-08-01 ENCOUNTER — Other Ambulatory Visit (HOSPITAL_BASED_OUTPATIENT_CLINIC_OR_DEPARTMENT_OTHER): Payer: Self-pay

## 2022-11-25 ENCOUNTER — Other Ambulatory Visit (HOSPITAL_BASED_OUTPATIENT_CLINIC_OR_DEPARTMENT_OTHER): Payer: Self-pay

## 2022-11-25 ENCOUNTER — Telehealth: Payer: 59 | Admitting: Physician Assistant

## 2022-11-25 DIAGNOSIS — J019 Acute sinusitis, unspecified: Secondary | ICD-10-CM | POA: Diagnosis not present

## 2022-11-25 DIAGNOSIS — B9689 Other specified bacterial agents as the cause of diseases classified elsewhere: Secondary | ICD-10-CM | POA: Diagnosis not present

## 2022-11-25 MED ORDER — AMOXICILLIN-POT CLAVULANATE 875-125 MG PO TABS
1.0000 | ORAL_TABLET | Freq: Two times a day (BID) | ORAL | 0 refills | Status: AC
  Filled 2022-11-25: qty 14, 7d supply, fill #0

## 2022-11-25 NOTE — Progress Notes (Signed)

## 2022-12-02 ENCOUNTER — Telehealth: Payer: 59 | Admitting: Nurse Practitioner

## 2022-12-02 DIAGNOSIS — J020 Streptococcal pharyngitis: Secondary | ICD-10-CM

## 2022-12-03 MED ORDER — PROMETHAZINE-DM 6.25-15 MG/5ML PO SYRP
5.0000 mL | ORAL_SOLUTION | Freq: Four times a day (QID) | ORAL | 0 refills | Status: AC | PRN
  Filled 2022-12-03: qty 118, 6d supply, fill #0

## 2022-12-03 MED ORDER — AMOXICILLIN 500 MG PO CAPS
500.0000 mg | ORAL_CAPSULE | Freq: Two times a day (BID) | ORAL | 0 refills | Status: DC
  Filled 2022-12-03: qty 20, 10d supply, fill #0

## 2022-12-03 MED ORDER — CEFDINIR 300 MG PO CAPS
300.0000 mg | ORAL_CAPSULE | Freq: Two times a day (BID) | ORAL | 0 refills | Status: AC
  Filled 2022-12-03 – 2022-12-05 (×2): qty 20, 10d supply, fill #0

## 2022-12-03 NOTE — Progress Notes (Signed)

## 2022-12-03 NOTE — Addendum Note (Signed)
Addended by: Bennie Pierini on: 12/03/2022 10:06 AM   Modules accepted: Orders

## 2022-12-03 NOTE — Addendum Note (Signed)
Addended by: Bennie Pierini on: 12/03/2022 09:37 AM   Modules accepted: Orders

## 2022-12-05 ENCOUNTER — Other Ambulatory Visit (HOSPITAL_BASED_OUTPATIENT_CLINIC_OR_DEPARTMENT_OTHER): Payer: Self-pay

## 2022-12-14 DIAGNOSIS — R7309 Other abnormal glucose: Secondary | ICD-10-CM | POA: Diagnosis not present

## 2022-12-14 DIAGNOSIS — Z79899 Other long term (current) drug therapy: Secondary | ICD-10-CM | POA: Diagnosis not present

## 2022-12-14 DIAGNOSIS — K8020 Calculus of gallbladder without cholecystitis without obstruction: Secondary | ICD-10-CM | POA: Diagnosis not present

## 2022-12-14 DIAGNOSIS — R7303 Prediabetes: Secondary | ICD-10-CM | POA: Diagnosis not present

## 2022-12-14 DIAGNOSIS — Z0000 Encounter for general adult medical examination without abnormal findings: Secondary | ICD-10-CM | POA: Diagnosis not present

## 2023-01-02 ENCOUNTER — Ambulatory Visit: Payer: Self-pay | Admitting: General Surgery

## 2023-01-02 DIAGNOSIS — K8020 Calculus of gallbladder without cholecystitis without obstruction: Secondary | ICD-10-CM | POA: Diagnosis not present

## 2023-01-02 NOTE — H&P (Signed)
Chief Complaint: New Consultation ( Gallstones)       History of Present Illness: Nancy Rangel is a 32 y.o. female who is seen today as an office consultation at the request of Dr. Doran Durand for evaluation of New Consultation ( Gallstones) .   Patient is a 32 year old female who comes in secondary to abdominal pain.  Patient was recently in the ER in November 2023.  She states that she had right upper quadrant pain epigastric pain according to records after eating fast food. Patient states that she had changed her diet however the pain did come back she is let her diet slide some.  She states that she has had right upper quadrant pain, epigastric pain and left upper quadrant pain.  She states this is almost on a daily basis.   She had some associated nausea vomiting.   Patient's had no previous abdominal surgery.       Review of Systems: A complete review of systems was obtained from the patient.  I have reviewed this information and discussed as appropriate with the patient.  See HPI as well for other ROS.   Review of Systems  Constitutional:  Negative for fever.  HENT:  Negative for congestion.   Eyes:  Negative for blurred vision.  Respiratory:  Negative for cough, shortness of breath and wheezing.   Cardiovascular:  Negative for chest pain and palpitations.  Gastrointestinal:  Positive for abdominal pain, nausea and vomiting. Negative for heartburn.  Genitourinary:  Negative for dysuria.  Musculoskeletal:  Negative for myalgias.  Skin:  Negative for rash.  Neurological:  Negative for dizziness and headaches.  Psychiatric/Behavioral:  Negative for depression and suicidal ideas.   All other systems reviewed and are negative.       Medical History: Past Medical History Past Medical History: Diagnosis Date  Anemia    Asthma, unspecified asthma severity, unspecified whether complicated, unspecified whether persistent ([facility])         Problem List There is no problem  list on file for this patient.     Past Surgical History History reviewed. No pertinent surgical history.     Allergies No Known Allergies    Medications Ordered Prior to Encounter Current Outpatient Medications on File Prior to Visit Medication Sig Dispense Refill  simethicone (MYLICON) 80 MG chewable tablet Take 80 mg by mouth every 6 (six) hours as needed for Flatulence        No current facility-administered medications on file prior to visit.      Family History Family History Problem Relation Age of Onset  High blood pressure (Hypertension) Mother    Diabetes Mother    Deep vein thrombosis (DVT or abnormal blood clot formation) Mother    Coronary Artery Disease (Blocked arteries around heart) Father    Diabetes Father    Deep vein thrombosis (DVT or abnormal blood clot formation) Sister        Tobacco Use History Social History    Tobacco Use Smoking Status Never Smokeless Tobacco Never      Social History Social History    Socioeconomic History  Marital status: Single Tobacco Use  Smoking status: Never  Smokeless tobacco: Never Substance and Sexual Activity  Alcohol use: Yes     Comment: social  Drug use: Never      Objective:     Vitals:   01/02/23 1104 BP: 116/88 Pulse: 82 Temp: 36.1 C (97 F) SpO2: 98% Weight: (!) 122.9 kg (271 lb) Height: 175.3 cm (5\' 9" )  Body mass index is 40.02 kg/m.   Physical Exam Constitutional:      Appearance: Normal appearance.  HENT:     Head: Normocephalic and atraumatic.     Mouth/Throat:     Mouth: Mucous membranes are moist.     Pharynx: Oropharynx is clear.  Eyes:     General: No scleral icterus.    Pupils: Pupils are equal, round, and reactive to light.  Cardiovascular:     Rate and Rhythm: Normal rate and regular rhythm.     Pulses: Normal pulses.     Heart sounds: No murmur heard.    No friction rub. No gallop.  Pulmonary:     Effort: Pulmonary effort is normal. No respiratory  distress.     Breath sounds: Normal breath sounds. No stridor.  Abdominal:     General: Abdomen is flat.  Musculoskeletal:        General: No swelling.  Skin:    General: Skin is warm.  Neurological:     General: No focal deficit present.     Mental Status: She is alert and oriented to person, place, and time. Mental status is at baseline.  Psychiatric:        Mood and Affect: Mood normal.        Thought Content: Thought content normal.        Judgment: Judgment normal.        Assessment and Plan: Diagnoses and all orders for this visit:   Symptomatic cholelithiasis     Nancy Rangel is a 32 y.o. female    1.  We will proceed to the OR for a lap cholecystectomy. 2. All risks and benefits were discussed with the patient to generally include: infection, bleeding, possible need for post op ERCP, damage to the bile ducts, and bile leak. Alternatives were offered and described.  All questions were answered and the patient voiced understanding of the procedure and wishes to proceed at this point with a laparoscopic cholecystectomy           No follow-ups on file.   Axel Filler, MD, [facility], [facility]

## 2023-01-03 ENCOUNTER — Other Ambulatory Visit (HOSPITAL_BASED_OUTPATIENT_CLINIC_OR_DEPARTMENT_OTHER): Payer: Self-pay

## 2023-01-03 MED ORDER — ONDANSETRON 4 MG PO TBDP
4.0000 mg | ORAL_TABLET | Freq: Three times a day (TID) | ORAL | 0 refills | Status: AC | PRN
  Filled 2023-01-03 – 2023-03-22 (×2): qty 15, 5d supply, fill #0

## 2023-01-12 DIAGNOSIS — Z579 Occupational exposure to unspecified risk factor: Secondary | ICD-10-CM | POA: Diagnosis not present

## 2023-01-13 ENCOUNTER — Other Ambulatory Visit (HOSPITAL_BASED_OUTPATIENT_CLINIC_OR_DEPARTMENT_OTHER): Payer: Self-pay

## 2023-03-22 ENCOUNTER — Other Ambulatory Visit (HOSPITAL_BASED_OUTPATIENT_CLINIC_OR_DEPARTMENT_OTHER): Payer: Self-pay

## 2023-06-16 DIAGNOSIS — R7303 Prediabetes: Secondary | ICD-10-CM | POA: Diagnosis not present

## 2023-08-02 ENCOUNTER — Other Ambulatory Visit (HOSPITAL_BASED_OUTPATIENT_CLINIC_OR_DEPARTMENT_OTHER): Payer: Self-pay

## 2023-08-02 DIAGNOSIS — J4530 Mild persistent asthma, uncomplicated: Secondary | ICD-10-CM | POA: Diagnosis not present

## 2023-08-02 MED ORDER — PREDNISONE 50 MG PO TABS
50.0000 mg | ORAL_TABLET | Freq: Every day | ORAL | 0 refills | Status: AC
  Filled 2023-08-02: qty 5, 5d supply, fill #0

## 2023-08-02 MED ORDER — BUDESONIDE-FORMOTEROL FUMARATE 80-4.5 MCG/ACT IN AERO
2.0000 | INHALATION_SPRAY | Freq: Two times a day (BID) | RESPIRATORY_TRACT | 3 refills | Status: AC
  Filled 2023-08-02: qty 10.2, 30d supply, fill #0
  Filled 2023-08-27: qty 10.2, 30d supply, fill #1

## 2023-09-08 ENCOUNTER — Other Ambulatory Visit (HOSPITAL_BASED_OUTPATIENT_CLINIC_OR_DEPARTMENT_OTHER): Payer: Self-pay

## 2023-12-27 DIAGNOSIS — Z0000 Encounter for general adult medical examination without abnormal findings: Secondary | ICD-10-CM | POA: Diagnosis not present

## 2023-12-27 DIAGNOSIS — Z13220 Encounter for screening for lipoid disorders: Secondary | ICD-10-CM | POA: Diagnosis not present

## 2023-12-27 DIAGNOSIS — R7303 Prediabetes: Secondary | ICD-10-CM | POA: Diagnosis not present

## 2024-01-25 DIAGNOSIS — Z1331 Encounter for screening for depression: Secondary | ICD-10-CM | POA: Diagnosis not present

## 2024-01-25 DIAGNOSIS — E66813 Obesity, class 3: Secondary | ICD-10-CM | POA: Diagnosis not present

## 2024-01-25 DIAGNOSIS — Z6841 Body Mass Index (BMI) 40.0 and over, adult: Secondary | ICD-10-CM | POA: Diagnosis not present

## 2024-01-25 DIAGNOSIS — R0602 Shortness of breath: Secondary | ICD-10-CM | POA: Diagnosis not present

## 2024-01-25 DIAGNOSIS — E785 Hyperlipidemia, unspecified: Secondary | ICD-10-CM | POA: Diagnosis not present

## 2024-01-25 DIAGNOSIS — R7303 Prediabetes: Secondary | ICD-10-CM | POA: Diagnosis not present

## 2024-01-25 DIAGNOSIS — E8889 Other specified metabolic disorders: Secondary | ICD-10-CM | POA: Diagnosis not present

## 2024-02-07 DIAGNOSIS — Z6839 Body mass index (BMI) 39.0-39.9, adult: Secondary | ICD-10-CM | POA: Diagnosis not present

## 2024-02-07 DIAGNOSIS — R7303 Prediabetes: Secondary | ICD-10-CM | POA: Diagnosis not present

## 2024-02-07 DIAGNOSIS — E8889 Other specified metabolic disorders: Secondary | ICD-10-CM | POA: Diagnosis not present

## 2024-02-07 DIAGNOSIS — E785 Hyperlipidemia, unspecified: Secondary | ICD-10-CM | POA: Diagnosis not present

## 2024-02-07 DIAGNOSIS — Z9189 Other specified personal risk factors, not elsewhere classified: Secondary | ICD-10-CM | POA: Diagnosis not present

## 2024-02-07 DIAGNOSIS — E66813 Obesity, class 3: Secondary | ICD-10-CM | POA: Diagnosis not present

## 2024-02-07 DIAGNOSIS — R0602 Shortness of breath: Secondary | ICD-10-CM | POA: Diagnosis not present

## 2024-02-21 ENCOUNTER — Other Ambulatory Visit (HOSPITAL_BASED_OUTPATIENT_CLINIC_OR_DEPARTMENT_OTHER): Payer: Self-pay

## 2024-02-21 DIAGNOSIS — R110 Nausea: Secondary | ICD-10-CM | POA: Diagnosis not present

## 2024-02-21 DIAGNOSIS — Z6839 Body mass index (BMI) 39.0-39.9, adult: Secondary | ICD-10-CM | POA: Diagnosis not present

## 2024-02-21 DIAGNOSIS — E8889 Other specified metabolic disorders: Secondary | ICD-10-CM | POA: Diagnosis not present

## 2024-02-21 DIAGNOSIS — R0602 Shortness of breath: Secondary | ICD-10-CM | POA: Diagnosis not present

## 2024-02-21 DIAGNOSIS — E66813 Obesity, class 3: Secondary | ICD-10-CM | POA: Diagnosis not present

## 2024-02-21 DIAGNOSIS — E785 Hyperlipidemia, unspecified: Secondary | ICD-10-CM | POA: Diagnosis not present

## 2024-02-21 DIAGNOSIS — Z9189 Other specified personal risk factors, not elsewhere classified: Secondary | ICD-10-CM | POA: Diagnosis not present

## 2024-02-21 DIAGNOSIS — R7303 Prediabetes: Secondary | ICD-10-CM | POA: Diagnosis not present

## 2024-02-21 MED ORDER — ONDANSETRON 4 MG PO TBDP
4.0000 mg | ORAL_TABLET | Freq: Every day | ORAL | 0 refills | Status: AC
  Filled 2024-02-21: qty 30, 30d supply, fill #0

## 2024-03-18 ENCOUNTER — Other Ambulatory Visit (HOSPITAL_BASED_OUTPATIENT_CLINIC_OR_DEPARTMENT_OTHER): Payer: Self-pay

## 2024-03-18 MED ORDER — WEGOVY 0.5 MG/0.5ML ~~LOC~~ SOAJ
0.5000 mL | SUBCUTANEOUS | 1 refills | Status: AC
  Filled 2024-03-18 – 2024-04-08 (×4): qty 2, 28d supply, fill #0

## 2024-03-21 ENCOUNTER — Other Ambulatory Visit (HOSPITAL_BASED_OUTPATIENT_CLINIC_OR_DEPARTMENT_OTHER): Payer: Self-pay

## 2024-04-05 ENCOUNTER — Other Ambulatory Visit (HOSPITAL_BASED_OUTPATIENT_CLINIC_OR_DEPARTMENT_OTHER): Payer: Self-pay

## 2024-04-08 ENCOUNTER — Other Ambulatory Visit (HOSPITAL_BASED_OUTPATIENT_CLINIC_OR_DEPARTMENT_OTHER): Payer: Self-pay

## 2024-04-11 DIAGNOSIS — Z9189 Other specified personal risk factors, not elsewhere classified: Secondary | ICD-10-CM | POA: Diagnosis not present

## 2024-04-11 DIAGNOSIS — K5909 Other constipation: Secondary | ICD-10-CM | POA: Diagnosis not present

## 2024-04-11 DIAGNOSIS — Z6841 Body Mass Index (BMI) 40.0 and over, adult: Secondary | ICD-10-CM | POA: Diagnosis not present

## 2024-04-11 DIAGNOSIS — R110 Nausea: Secondary | ICD-10-CM | POA: Diagnosis not present

## 2024-04-11 DIAGNOSIS — R7303 Prediabetes: Secondary | ICD-10-CM | POA: Diagnosis not present

## 2024-04-11 DIAGNOSIS — E8889 Other specified metabolic disorders: Secondary | ICD-10-CM | POA: Diagnosis not present

## 2024-04-11 DIAGNOSIS — E66813 Obesity, class 3: Secondary | ICD-10-CM | POA: Diagnosis not present

## 2024-04-11 DIAGNOSIS — E785 Hyperlipidemia, unspecified: Secondary | ICD-10-CM | POA: Diagnosis not present

## 2024-04-11 DIAGNOSIS — R0602 Shortness of breath: Secondary | ICD-10-CM | POA: Diagnosis not present

## 2024-05-09 DIAGNOSIS — H5213 Myopia, bilateral: Secondary | ICD-10-CM | POA: Diagnosis not present

## 2024-05-09 DIAGNOSIS — H52223 Regular astigmatism, bilateral: Secondary | ICD-10-CM | POA: Diagnosis not present

## 2024-06-26 DIAGNOSIS — R0602 Shortness of breath: Secondary | ICD-10-CM | POA: Diagnosis not present

## 2024-06-26 DIAGNOSIS — R7303 Prediabetes: Secondary | ICD-10-CM | POA: Diagnosis not present

## 2024-06-26 DIAGNOSIS — R110 Nausea: Secondary | ICD-10-CM | POA: Diagnosis not present

## 2024-06-26 DIAGNOSIS — E66813 Obesity, class 3: Secondary | ICD-10-CM | POA: Diagnosis not present

## 2024-06-26 DIAGNOSIS — E785 Hyperlipidemia, unspecified: Secondary | ICD-10-CM | POA: Diagnosis not present

## 2024-06-26 DIAGNOSIS — E8889 Other specified metabolic disorders: Secondary | ICD-10-CM | POA: Diagnosis not present

## 2024-06-26 DIAGNOSIS — Z6841 Body Mass Index (BMI) 40.0 and over, adult: Secondary | ICD-10-CM | POA: Diagnosis not present

## 2024-06-26 DIAGNOSIS — K5909 Other constipation: Secondary | ICD-10-CM | POA: Diagnosis not present

## 2024-06-27 DIAGNOSIS — R7303 Prediabetes: Secondary | ICD-10-CM | POA: Diagnosis not present
